# Patient Record
Sex: Female | Born: 1960 | Race: White | Hispanic: No | Marital: Single | Smoking: Never smoker
Health system: Southern US, Community
[De-identification: ages and names within clinical notes are randomized; demographics above are authoritative.]

## PROBLEM LIST (undated history)

## (undated) DIAGNOSIS — E7800 Pure hypercholesterolemia, unspecified: Secondary | ICD-10-CM

---

## 1997-09-06 ENCOUNTER — Inpatient Hospital Stay (HOSPITAL_COMMUNITY): Admission: AD | Admit: 1997-09-06 | Discharge: 1997-09-10 | Payer: Self-pay | Admitting: *Deleted

## 1997-09-12 ENCOUNTER — Encounter: Admission: RE | Admit: 1997-09-12 | Discharge: 1997-12-11 | Payer: Self-pay | Admitting: Obstetrics and Gynecology

## 2001-08-14 ENCOUNTER — Other Ambulatory Visit: Admission: RE | Admit: 2001-08-14 | Discharge: 2001-08-14 | Payer: Self-pay | Admitting: Obstetrics and Gynecology

## 2003-06-25 ENCOUNTER — Other Ambulatory Visit: Admission: RE | Admit: 2003-06-25 | Discharge: 2003-06-25 | Payer: Self-pay | Admitting: Obstetrics and Gynecology

## 2004-04-21 ENCOUNTER — Other Ambulatory Visit: Admission: RE | Admit: 2004-04-21 | Discharge: 2004-04-21 | Payer: Self-pay | Admitting: Obstetrics and Gynecology

## 2004-12-22 ENCOUNTER — Other Ambulatory Visit: Admission: RE | Admit: 2004-12-22 | Discharge: 2004-12-22 | Payer: Self-pay | Admitting: Obstetrics and Gynecology

## 2013-10-26 ENCOUNTER — Emergency Department (HOSPITAL_COMMUNITY): Payer: BC Managed Care – PPO

## 2013-10-26 ENCOUNTER — Encounter (HOSPITAL_COMMUNITY): Payer: Self-pay | Admitting: Emergency Medicine

## 2013-10-26 ENCOUNTER — Emergency Department (HOSPITAL_COMMUNITY)
Admission: EM | Admit: 2013-10-26 | Discharge: 2013-10-26 | Disposition: A | Discharge status: Home / Self Care | Payer: BC Managed Care – PPO | Attending: Emergency Medicine | Admitting: Emergency Medicine

## 2013-10-26 DIAGNOSIS — S8012XA Contusion of left lower leg, initial encounter: Secondary | ICD-10-CM

## 2013-10-26 DIAGNOSIS — Y9389 Activity, other specified: Secondary | ICD-10-CM | POA: Insufficient documentation

## 2013-10-26 DIAGNOSIS — S5002XA Contusion of left elbow, initial encounter: Secondary | ICD-10-CM

## 2013-10-26 DIAGNOSIS — Y92410 Unspecified street and highway as the place of occurrence of the external cause: Secondary | ICD-10-CM | POA: Insufficient documentation

## 2013-10-26 DIAGNOSIS — S5000XA Contusion of unspecified elbow, initial encounter: Secondary | ICD-10-CM | POA: Insufficient documentation

## 2013-10-26 DIAGNOSIS — E7800 Pure hypercholesterolemia, unspecified: Secondary | ICD-10-CM | POA: Insufficient documentation

## 2013-10-26 DIAGNOSIS — S8010XA Contusion of unspecified lower leg, initial encounter: Secondary | ICD-10-CM | POA: Insufficient documentation

## 2013-10-26 DIAGNOSIS — Z79899 Other long term (current) drug therapy: Secondary | ICD-10-CM | POA: Insufficient documentation

## 2013-10-26 HISTORY — DX: Pure hypercholesterolemia, unspecified: E78.00

## 2013-10-26 MED ORDER — IBUPROFEN 200 MG PO TABS
600.0000 mg | ORAL_TABLET | Freq: Once | ORAL | Status: AC
  Administered 2013-10-26: 600 mg via ORAL
  Filled 2013-10-26 (×2): qty 1

## 2013-10-26 MED ORDER — OXYCODONE-ACETAMINOPHEN 5-325 MG PO TABS: 1.0000 | ORAL_TABLET | Freq: Four times a day (QID) | ORAL | Status: DC | PRN
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
## 2014-09-29 ENCOUNTER — Other Ambulatory Visit: Payer: Self-pay | Admitting: Obstetrics and Gynecology

## 2014-09-29 DIAGNOSIS — R928 Other abnormal and inconclusive findings on diagnostic imaging of breast: Secondary | ICD-10-CM

## 2014-10-09 ENCOUNTER — Other Ambulatory Visit: Payer: Self-pay | Admitting: Obstetrics and Gynecology

## 2014-10-09 ENCOUNTER — Ambulatory Visit
Admission: RE | Admit: 2014-10-09 | Discharge: 2014-10-09 | Disposition: A | Discharge status: Home / Self Care | Payer: BC Managed Care – PPO | Source: Ambulatory Visit | Attending: Obstetrics and Gynecology | Admitting: Obstetrics and Gynecology

## 2014-10-09 DIAGNOSIS — R928 Other abnormal and inconclusive findings on diagnostic imaging of breast: Secondary | ICD-10-CM

## 2016-05-19 ENCOUNTER — Ambulatory Visit (INDEPENDENT_AMBULATORY_CARE_PROVIDER_SITE_OTHER): Payer: Worker's Compensation | Admitting: Sports Medicine

## 2016-05-19 ENCOUNTER — Encounter (INDEPENDENT_AMBULATORY_CARE_PROVIDER_SITE_OTHER): Payer: Self-pay | Admitting: Sports Medicine

## 2016-05-19 VITALS — BP 130/81 | HR 90 | Ht 62.0 in | Wt 185.0 lb

## 2016-05-19 DIAGNOSIS — S42291A Other displaced fracture of upper end of right humerus, initial encounter for closed fracture: Secondary | ICD-10-CM

## 2016-05-19 MED ORDER — OXYCODONE-ACETAMINOPHEN 5-325 MG PO TABS: 1.0000 | ORAL_TABLET | ORAL | 0 refills | Status: DC | PRN

## 2016-05-19 NOTE — Progress Notes (Signed)
   Catherine PulseDianne M Caterino - 55 y.o. female MRN [id number]  Date of birth: 05-08-1961  Office Visit Note: Visit Date: 05/19/2016 PCP: Johny BlamerHARRIS, WILLIAM, MD Referred by: Johny BlamerHarris, William, MD  Subjective: Chief Complaint  Patient presents with  . Right Shoulder - Pain  . Neck - Pain  . Fracture   HPI: Patient fell yesterday landing on her right shoulder. Seen in urgent care & x-rays obtained as below. She's been in a shoulder immobilizer. Taking ibuprofen pain medications & minimizing use of the upper extremity has been only mildly helpful. No prior issues with the shoulder. No significant pain radiating down into the hand. She did have difficult time sleeping last night.     ROS Otherwise per HPI.  Assessment & Plan: Visit Diagnoses:  1. Other closed displaced fracture of proximal end of right humerus, initial encounter     Plan: Findings:  Acute fracture of the proximal right humeral head & neck with nondisplaced fracture line through the surgical neck. Overall anatomic alignment. Will anticipate nonsurgical management reevaluate in one week with repeat 2V x-ray of the right shoulder. Out of work until reevaluation. Pain medication provided today.    Meds & Orders:  Meds ordered this encounter  Medications  . oxyCODONE-acetaminophen (PERCOCET/ROXICET) 5-325 MG tablet    Sig: Take 1 tablet by mouth every 4 (four) hours as needed for severe pain.    Dispense:  30 tablet    Refill:  0   No orders of the defined types were placed in this encounter.   Follow-up: Return in about 1 week (around 05/26/2016) for repeat X-rays.   Procedures: No procedures performed  No notes on file   Clinical History: No specialty comments available.  She reports that she has never smoked. She has never used smokeless tobacco. No results for input(s): HGBA1C, LABURIC in the last 8760 hours.  Objective:  VS:  HT:5\' 2"  (157.5 cm)   WT:185 lb (83.9 kg)  BMI:33.9    BP:130/81  HR:90bpm  TEMP: ( )  RESP:    Physical Exam  Constitutional:  Adult female. No acute distress. Alert & appropriate. Right upper extremity is in an arm sling. She has generalized swelling in the proximal arm but hand & elbow are normal appearing. Strength is intact. Radial nerve is functioning. She has pain with any type of rotation of the humerus but the humerus doesn't move as a unit. Sensation is intact to light touch. She has moderate tenderness over the clavicle & ac joint but this is mild compared to palpation of the proximal humerus which is severe.    Ortho Exam Imaging: Outside x-rays reviewed as above   Past Medical/Family/Surgical/Social History: Medications & Allergies reviewed per EMR There are no active problems to display for this patient.  Past Medical History:  Diagnosis Date  . High cholesterol    No family history on file. Past Surgical History:  Procedure Laterality Date  . CESAREAN SECTION     Social History   Occupational History  . Not on file.   Social History Main Topics  . Smoking status: Never Smoker  . Smokeless tobacco: Never Used  . Alcohol use No  . Drug use: No  . Sexual activity: Not on file

## 2016-05-19 NOTE — Patient Instructions (Signed)
Continue taking ibuprofen on a regular basis Pain medication only as needed. Continue using the shoulder immobilizer provided.

## 2016-05-26 ENCOUNTER — Ambulatory Visit (INDEPENDENT_AMBULATORY_CARE_PROVIDER_SITE_OTHER): Payer: Worker's Compensation

## 2016-05-26 ENCOUNTER — Encounter (INDEPENDENT_AMBULATORY_CARE_PROVIDER_SITE_OTHER): Payer: Self-pay | Admitting: Sports Medicine

## 2016-05-26 ENCOUNTER — Ambulatory Visit (INDEPENDENT_AMBULATORY_CARE_PROVIDER_SITE_OTHER): Payer: Worker's Compensation | Admitting: Sports Medicine

## 2016-05-26 VITALS — BP 124/74 | HR 67 | Ht 62.0 in | Wt 185.0 lb

## 2016-05-26 DIAGNOSIS — M25511 Pain in right shoulder: Secondary | ICD-10-CM

## 2016-05-26 DIAGNOSIS — S42291A Other displaced fracture of upper end of right humerus, initial encounter for closed fracture: Secondary | ICD-10-CM

## 2016-05-26 MED ORDER — NAPROXEN 500 MG PO TABS: 500.0000 mg | ORAL_TABLET | Freq: Two times a day (BID) | ORAL | 1 refills | Status: DC

## 2016-05-26 NOTE — Progress Notes (Signed)
   Catherine Bean - 55 y.o. female MRN [id number]  Date of birth: 10-03-1960  Office Visit Note: Visit Date: 05/26/2016 PCP: Johny BlamerHARRIS, WILLIAM, MD Referred by: Johny BlamerHarris, William, MD  Subjective: Chief Complaint  Patient presents with  . Right Shoulder - Follow-up  . Follow-up    Patient states soreness this morning.  Patient is wearing a sling.   HPI: She continues having pain on a daily basis. Pain is severe with any type shoulder motion. Using arm immobilizer. Taking iuprofen with only minimal improvement.     ROS Otherwise per HPI.  Assessment & Plan: Visit Diagnoses:  1. Other closed displaced fracture of proximal end of right humerus, initial encounter   2. Acute pain of right shoulder     Plan: Findings:  Fracture stable. Continue with arm immobilizer. Will transition to naproxen instead of ibuprofen. I continue as needed. 2 week follow-up for repeat 2V x-ray. Out of work 2 weeks   Meds & Orders:  Meds ordered this encounter  Medications  . naproxen (NAPROSYN) 500 MG tablet    Sig: Take 1 tablet (500 mg total) by mouth 2 (two) times daily with a meal.    Dispense:  60 tablet    Refill:  1    Orders Placed This Encounter  Procedures  . XR Shoulder Right    Follow-up: Return in about 2 weeks (around 06/09/2016) for repeat X-rays.   Procedures: No procedures performed  No notes on file   Clinical History: No specialty comments available.  She reports that she has never smoked. She has never used smokeless tobacco. No results for input(s): HGBA1C, LABURIC in the last 8760 hours.  Objective:  VS:  HT:5\' 2"  (157.5 cm)   WT:185 lb (83.9 kg)  BMI:33.9    BP:124/74  HR:67bpm  TEMP: ( )  RESP:  Physical Exam  Ortho Exam  Adult female. No acute distress. Wearing an arm immobilizer. Right arm has bruising along the medial aspect of the upper arm. Pain with any type of range of motion with shoulder does move his the unit between 20 & 30 of internal & external  rotation. Abduction & forward flexion limited to 10:15 degrees by pain. Radial pulses 2+/4. Grip strength intact. Imaging: Xr Shoulder Right  Result Date: 05/26/2016 Findings: Proximal humerus fracture is overall well aligned with significant shift). Slightly comminuted. Impression: Slightly comminuted stable proximal humerus fracture.   Past Medical/Family/Surgical/Social History: Medications & Allergies reviewed per EMR There are no active problems to display for this patient.  Past Medical History:  Diagnosis Date  . High cholesterol    No family history on file. Past Surgical History:  Procedure Laterality Date  . CESAREAN SECTION     Social History   Occupational History  . Not on file.   Social History Main Topics  . Smoking status: Never Smoker  . Smokeless tobacco: Never Used  . Alcohol use No  . Drug use: No  . Sexual activity: Not on file

## 2016-06-09 ENCOUNTER — Ambulatory Visit (INDEPENDENT_AMBULATORY_CARE_PROVIDER_SITE_OTHER): Payer: Worker's Compensation

## 2016-06-09 ENCOUNTER — Ambulatory Visit (INDEPENDENT_AMBULATORY_CARE_PROVIDER_SITE_OTHER): Payer: Worker's Compensation | Admitting: Sports Medicine

## 2016-06-09 VITALS — BP 126/75 | HR 90

## 2016-06-09 DIAGNOSIS — S42291A Other displaced fracture of upper end of right humerus, initial encounter for closed fracture: Secondary | ICD-10-CM

## 2016-06-09 DIAGNOSIS — M25511 Pain in right shoulder: Secondary | ICD-10-CM

## 2016-06-09 NOTE — Progress Notes (Signed)
   Catherine Bean - 55 y.o. female MRN [id number]  Date of birth: [birth year]  Office Visit Note: Visit Date: 06/09/2016 PCP: Johny BlamerHARRIS, WILLIAM, MD Referred by: Johny BlamerHarris, William, MD  Subjective: Chief Complaint  Patient presents with  . Right Shoulder - Follow-up    Patient states she does see some improvement. Still "achy". Wearing sling. Shoulder still waking her at night. Wearing a bra is painful. She is getting some ROM back in are without as much pain. Wants to discuss going back to work.   HPI: Status post fall on 05/18/2016. She reports continuing to work on range of motion of the shoulder & she is able to abduction & internal & external rotate significantly better. She is taking Aleve only. Continues to be out of work. She would like to return. No significant dysthesia. ROS: Otherwise per HPI.   Clinical History: No specialty comments available.  She reports that she has never smoked. She has never used smokeless tobacco.  No results for input(s): HGBA1C, LABURIC in the last 8760 hours.  Assessment & Plan: Visit Diagnoses:    ICD-9-CM ICD-10-CM   1. Other closed displaced fracture of proximal end of right humerus, initial encounter 812.09 S42.291A XR Shoulder Right  2. Acute pain of right shoulder 719.41 M25.511 XR Shoulder Right    Plan: X-rays are stable. We will allow her to return to work starting next week at the four-week mark & have restrictions as outlined. I would like for her to continue working on Codman exercises as well as gentle range of motion but nothing ever had. Continue working on elbow flexion & extension as well as coming out of the sling on occasion. Okay to continue until I see her next time will need to discontinue at that time. She is not requiring pain medicines at this time other than Aleve. Follow-up: Return in about 2 weeks (around 06/23/2016) for repeat X-rays.  Meds: No orders of the defined types were placed in this encounter.  Procedures: No notes on  file   Objective:  VS:  HT:    WT:   BMI:     BP:126/75  HR:90bpm  TEMP: ( )  RESP:  Physical Exam: Adult female. No acute distress. Wearing an arm immobilizer. Her right arm is interested shoulder immobilizer but she is able to abduction actively to 30 & internally/externally rotate approximately 60.  Grip strength is intact. Radial pulse 2+/4. Decreased swelling & decreased pain with palpation. Imaging: Xr Shoulder Right  Result Date: 06/09/2016 Findings: Proximal humerus fracture is overall well aligned without significant shift. Slightly comminuted. Impression: Slightly comminuted stable proximal humerus fracture.  Xr Shoulder Right  Result Date: 05/26/2016 Findings: Proximal humerus fracture is overall well aligned with significant shift). Slightly comminuted. Impression: Slightly comminuted stable proximal humerus fracture.   Past Medical/Family/Surgical/Social History: Medications & Allergies reviewed per EMR There are no active problems to display for this patient.  Past Medical History:  Diagnosis Date  . High cholesterol    No family history on file. Past Surgical History:  Procedure Laterality Date  . CESAREAN SECTION     Social History   Occupational History  . Not on file.   Social History Main Topics  . Smoking status: Never Smoker  . Smokeless tobacco: Never Used  . Alcohol use No  . Drug use: No  . Sexual activity: Not on file

## 2016-06-23 ENCOUNTER — Encounter (INDEPENDENT_AMBULATORY_CARE_PROVIDER_SITE_OTHER): Payer: Self-pay | Admitting: Sports Medicine

## 2016-06-23 ENCOUNTER — Ambulatory Visit (INDEPENDENT_AMBULATORY_CARE_PROVIDER_SITE_OTHER): Payer: Worker's Compensation | Admitting: Sports Medicine

## 2016-06-23 ENCOUNTER — Ambulatory Visit (INDEPENDENT_AMBULATORY_CARE_PROVIDER_SITE_OTHER): Payer: Worker's Compensation

## 2016-06-23 VITALS — BP 130/74 | HR 71 | Ht 62.0 in | Wt 185.0 lb

## 2016-06-23 DIAGNOSIS — M25511 Pain in right shoulder: Secondary | ICD-10-CM

## 2016-06-23 DIAGNOSIS — S42291D Other displaced fracture of upper end of right humerus, subsequent encounter for fracture with routine healing: Secondary | ICD-10-CM

## 2016-06-23 NOTE — Progress Notes (Signed)
   Catherine PulseDianne M Bean - 55 y.o. female MRN [id number]  Date of birth: Sep 21, 1960  Office Visit Note: Visit Date: 06/23/2016 PCP: Johny BlamerHARRIS, WILLIAM, MD Referred by: Johny BlamerHarris, William, MD  Subjective: Chief Complaint  Patient presents with  . Right Shoulder - Follow-up  . Follow-up    Patient states that she thinks that shoulder is doing good.  Wearing sling, has more mobility.  Doing 4 hours at work.  Would like to know how much longer in wearing the sling?   HPI:  S/p fall at work on 05/18/2016. No complaints. Agree with history as above. No significant numbness or tingling. Pain is significantly improved. No longer having significant discomfort at night. Not taking any medications. ROS: Otherwise per HPI.   Clinical History: No specialty comments available.  She reports that she has never smoked. She has never used smokeless tobacco.  No results for input(s): HGBA1C, LABURIC in the last 8760 hours.  Assessment & Plan: Visit Diagnoses:    ICD-9-CM ICD-10-CM   1. Acute pain of right shoulder 719.41 M25.511 XR Shoulder Right    Plan: Fracture is stable & healing well. We will have her plan to increase her daily workload to 8 hours after the new year. Follow-up in 4 weeks for repeat x-rays with Dr. Lajoyce Cornersuda as I am changing practices. I'm happy to see her in my new location however she would prefer to stay at this location. Increasing range of motion & physical activity. If any lack of improvement follow-up with did discuss possible referral to physical therapy practice patient will do well without this. Follow-up: Return in about 3 weeks (around 07/14/2016) for with Dr. Lajoyce Cornersuda for routine post fracture care and hopeful release to full duty.  Meds: No orders of the defined types were placed in this encounter.  Procedures: No notes on file   Objective:  VS:  HT:5\' 2"  (157.5 cm)   WT:185 lb (83.9 kg)  BMI:33.9    BP:130/74  HR:71bpm  TEMP: ( )  RESP:  Physical Exam:  Shoulder is overall well line.  She has improved range of motion with 40 of abduction & in [facility] flexion. Grip strength is intact. Internal rotation & external rotation strength is intact although mildly painful to resistance. Sensation intact to light touch. Imaging: Xr Shoulder Right  Result Date: 06/23/2016 Findings: Proximal humerus fracture is overall well aligned without significant shift. Slightly comminuted.  No interval change. Evidence of early sclerosis. Impression: Slightly comminuted stable proximal humerus fracture.  Xr Shoulder Right  Result Date: 06/09/2016 Findings: Proximal humerus fracture is overall well aligned without significant shift. Slightly comminuted. Impression: Slightly comminuted stable proximal humerus fracture.  Xr Shoulder Right  Result Date: 05/26/2016 Findings: Proximal humerus fracture is overall well aligned with significant shift). Slightly comminuted. Impression: Slightly comminuted stable proximal humerus fracture.   Past Medical/Family/Surgical/Social History: Medications & Allergies reviewed per EMR There are no active problems to display for this patient.  Past Medical History:  Diagnosis Date  . High cholesterol    No family history on file. Past Surgical History:  Procedure Laterality Date  . CESAREAN SECTION     Social History   Occupational History  . Not on file.   Social History Main Topics  . Smoking status: Never Smoker  . Smokeless tobacco: Never Used  . Alcohol use No  . Drug use: No  . Sexual activity: Not on file

## 2016-07-15 ENCOUNTER — Ambulatory Visit (INDEPENDENT_AMBULATORY_CARE_PROVIDER_SITE_OTHER): Payer: Worker's Compensation | Admitting: Orthopedic Surgery

## 2016-07-15 VITALS — Ht 62.0 in | Wt 185.0 lb

## 2016-07-15 DIAGNOSIS — S42291D Other displaced fracture of upper end of right humerus, subsequent encounter for fracture with routine healing: Secondary | ICD-10-CM

## 2016-07-15 DIAGNOSIS — S42201D Unspecified fracture of upper end of right humerus, subsequent encounter for fracture with routine healing: Secondary | ICD-10-CM | POA: Insufficient documentation

## 2016-07-15 NOTE — Progress Notes (Signed)
Office Visit Note   Patient: Catherine Bean           Date of Birth: [birth year]           MRN: [id number] Visit Date: 07/15/2016              Requested by: Johny Blamer, MD [phone] [address] [place], [place] [address] PCP: Johny Blamer, MD  Chief Complaint  Patient presents with  . Right Shoulder - Follow-up    Work comp injury s/p fall at work 05/18/16     HPI: Patient has returned to work for 8 hours day. She states that she is not sleeping well. "everytime I turn I feel it" she does not think that she is able to do any more than 8 hours a day. States that she has a draft at the window of her office and the cold makes her shoulder achy. Questions when she will get her ROM back. She is able to do more of her ADLs she is doing pendulum exercise and wall walking and taking naproxen prn pain. And states that this works well for her pain. Autumn L Forrest, RMA  Him the patient is a 56 year old woman who is seen today in follow-up for a right proximal humerus fracture. Is well known to Dr. Berline Chough. This is a workers comp case.  Continues to have aching pain which is worse at the end of the day. States occasionally wakes from sleep due to increased pain. Also complains that the cold weather has really setback her healing. Wonders when she will have full range of motion. However states she is able to do all of her ADLs as well as her required duties at work. feels she is unable to work any longer than 8 hours a day at this time.    Assessment & Plan: Visit Diagnoses:  1. Other closed displaced fracture of proximal end of right humerus with routine healing, subsequent encounter     Plan: Continue aggressive work on home exercise program. Him recommended she continue taking Aleve him as needed for pain. Heat before exercise program may be helpful. Have provided another note for work.  Follow-Up Instructions: Return in about 2 months (around 09/12/2016).    Exam: Patient is alert  and oriented. No adenopathy. Well-dressed. Normal affect. Respirations easy.  Right Ankle Exam  Other  Sensation: normal   Comments:  Grip strength is intact. Internal rotation & external rotation strength is intact although mildly painful to resistance.    Right Shoulder Exam   Tenderness  The patient is experiencing no tenderness.    Range of Motion  Active Abduction: 90   Muscle Strength  The patient has normal right shoulder strength.  Other  Pulse: present       Imaging: No results found.  Orders:  No orders of the defined types were placed in this encounter.  No orders of the defined types were placed in this encounter.    Procedures: No procedures performed  Clinical Data: No additional findings.  Subjective: Review of Systems  Constitutional: Negative for chills and fever.  Musculoskeletal: Positive for arthralgias.    Objective: Vital Signs: Ht 5\' 2"  (1.575 m)   Wt 185 lb (83.9 kg)   BMI 33.84 kg/m   Specialty Comments:  No specialty comments available.  PMFS History: Patient Active Problem List   Diagnosis Date Noted  . Closed fracture of proximal end of right humerus with routine healing 07/15/2016   Past  Medical History:  Diagnosis Date  . High cholesterol     No family history on file.  Past Surgical History:  Procedure Laterality Date  . CESAREAN SECTION     Social History   Occupational History  . Not on file.   Social History Main Topics  . Smoking status: Never Smoker  . Smokeless tobacco: Never Used  . Alcohol use No  . Drug use: No  . Sexual activity: Not on file

## 2016-10-05 ENCOUNTER — Telehealth (INDEPENDENT_AMBULATORY_CARE_PROVIDER_SITE_OTHER): Payer: Self-pay

## 2016-10-05 NOTE — Telephone Encounter (Signed)
Fax tomorrow office note to [employer]Corrine Phillips after pt is seen per her request

## 2016-10-06 ENCOUNTER — Ambulatory Visit (INDEPENDENT_AMBULATORY_CARE_PROVIDER_SITE_OTHER): Payer: Worker's Compensation | Admitting: Family

## 2016-10-06 ENCOUNTER — Encounter (INDEPENDENT_AMBULATORY_CARE_PROVIDER_SITE_OTHER): Payer: Self-pay | Admitting: Orthopedic Surgery

## 2016-10-06 ENCOUNTER — Ambulatory Visit (INDEPENDENT_AMBULATORY_CARE_PROVIDER_SITE_OTHER): Payer: Worker's Compensation

## 2016-10-06 DIAGNOSIS — M25511 Pain in right shoulder: Secondary | ICD-10-CM

## 2016-10-06 DIAGNOSIS — S42291D Other displaced fracture of upper end of right humerus, subsequent encounter for fracture with routine healing: Secondary | ICD-10-CM | POA: Diagnosis not present

## 2016-10-10 NOTE — Progress Notes (Signed)
Office Visit Note   Patient: Catherine Bean           Date of Birth: 03/07/1961           MRN: [id number] Visit Date: 10/06/2016              Requested by: Johny Blamer, MD [phone] [address] [place], [place] [address] PCP: Johny Blamer, MD  Chief Complaint  Patient presents with  . Right Shoulder - Follow-up      HPI: The patient is a 56 year old woman who presents today for evaluation of right shoulder pain. She is status post a proximal humerus fracture in November of last year. This is a workman's comp case. She has been having good success with home exercises. However she states she is still lacks some range of motion. Is having difficulty with extension and abduction. Does state she is able to reach overhead and do her hair and do most of the things she likes to do however feels this little bit more range of motion to be regained. Has been using ibuprofen as needed for pain and feels this is adequate.  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  Assessment & Plan: Visit Diagnoses:  1. Acute pain of right shoulder   2. Other closed displaced fracture of proximal end of right humerus with routine healing, subsequent encounter     Plan: Given instructions as well as a handout for further sole shoulder exercises and stretches. She will continue with her home exercise program may continue using ice as needed. We'll follow-up in office as needed in about 6 weeks. May consider physical therapy at that time.  Follow-Up Instructions: Return in about 4 weeks (around 11/03/2016).   Right Shoulder Exam   Tenderness  The patient is experiencing no tenderness.    Range of Motion  Active Abduction: normal  Forward Flexion: 120   Muscle Strength  The patient has normal right shoulder strength.  Tests  Impingement: negative      Patient is alert,  oriented, no adenopathy, well-dressed, normal affect, normal respiratory effort.   Imaging: No results found.  Labs: No results found for: HGBA1C, ESRSEDRATE, CRP, LABURIC, REPTSTATUS, GRAMSTAIN, CULT, LABORGA  Orders:  Orders Placed This Encounter  Procedures  . XR Shoulder Right   No orders of the defined types were placed in this encounter.    Procedures: No procedures performed  Clinical Data: No additional findings.  ROS:  All other systems negative, except as noted in the HPI. Review of Systems  Constitutional: Negative for chills and fever.  Musculoskeletal: Positive for arthralgias. Negative for joint swelling.    Objective: Vital Signs: There were no vitals taken for this visit.  Specialty Comments:  No specialty comments available.  PMFS History: Patient Active Problem List   Diagnosis Date Noted  . Closed fracture of proximal end of right humerus with routine healing 07/15/2016   Past Medical History:  Diagnosis Date  . High cholesterol     No family history on file.  Past Surgical History:  Procedure Laterality Date  . CESAREAN SECTION     Social History   Occupational History  . Not on file.   Social History Main Topics  . Smoking status: Never Smoker  . Smokeless tobacco: Never Used  . Alcohol use No  . Drug use: No  . Sexual  activity: Not on file

## 2016-10-11 NOTE — Telephone Encounter (Signed)
faxed

## 2016-11-18 ENCOUNTER — Ambulatory Visit (INDEPENDENT_AMBULATORY_CARE_PROVIDER_SITE_OTHER): Payer: Self-pay | Admitting: Orthopedic Surgery

## 2016-11-18 ENCOUNTER — Encounter (INDEPENDENT_AMBULATORY_CARE_PROVIDER_SITE_OTHER): Payer: Self-pay | Admitting: Orthopedic Surgery

## 2016-11-18 ENCOUNTER — Ambulatory Visit (INDEPENDENT_AMBULATORY_CARE_PROVIDER_SITE_OTHER): Payer: Worker's Compensation | Admitting: Family

## 2016-11-18 VITALS — Ht 62.0 in | Wt 185.0 lb

## 2016-11-18 DIAGNOSIS — S42291D Other displaced fracture of upper end of right humerus, subsequent encounter for fracture with routine healing: Secondary | ICD-10-CM | POA: Diagnosis not present

## 2016-11-18 NOTE — Progress Notes (Signed)
   Office Visit Note   Patient: Catherine PulseDianne M Donado           Date of Birth: [birth year]           MRN: [id number] Visit Date: 11/18/2016              Requested by: Johny BlamerHarris, William, MD [phone] [address] [place], [place] [id number] PCP: Johny BlamerHarris, William, MD  Chief Complaint  Patient presents with  . Right Shoulder - Follow-up      HPI: The patient is a 56 year old woman who presents today for evaluation of right shoulder pain. She is status post a proximal humerus fracture in November of last year. This is a workman's comp case. She feels she has reached a wall with her with home exercises. Has purchased a pulley system for her door frame and uses this for ROM exercises.   States she is still lacks some range of motion. Is having difficulty with extension and abduction. Does state she is able to reach overhead and do her hair and do most of the things she likes to do however feels she still has a bit more range of motion to be regained as compared to her left upper extremity.   No difficulty with pain.    Assessment & Plan: Visit Diagnoses:  1. Other closed displaced fracture of proximal end of right humerus with routine healing, subsequent encounter     Plan: will set her up for PT. Will get this approved with her WC prior. Follow up in office as needed.   Follow-Up Instructions: Return if symptoms worsen or fail to improve.   Right Shoulder Exam   Tenderness  The patient is experiencing no tenderness.    Muscle Strength  The patient has normal right shoulder strength.  Tests  Impingement: negative  Comments:  Lacks 15 degrees of full flexion and 10 degrees of abduction      Patient is alert, oriented, no adenopathy, well-dressed, normal affect, normal respiratory effort.   Imaging: No results found.  Labs: No results found for: HGBA1C, ESRSEDRATE, CRP, LABURIC, REPTSTATUS, GRAMSTAIN, CULT, LABORGA  Orders:  No orders of the defined types were placed in  this encounter.  No orders of the defined types were placed in this encounter.    Procedures: No procedures performed  Clinical Data: No additional findings.  ROS:  All other systems negative, except as noted in the HPI. Review of Systems  Constitutional: Negative for chills and fever.  Musculoskeletal: Positive for arthralgias. Negative for joint swelling.    Objective: Vital Signs: Ht 5\' 2"  (1.575 m)   Wt 185 lb (83.9 kg)   BMI 33.84 kg/m   Specialty Comments:  No specialty comments available.  PMFS History: Patient Active Problem List   Diagnosis Date Noted  . Closed fracture of proximal end of right humerus with routine healing 07/15/2016   Past Medical History:  Diagnosis Date  . High cholesterol     History reviewed. No pertinent family history.  Past Surgical History:  Procedure Laterality Date  . CESAREAN SECTION     Social History   Occupational History  . Not on file.   Social History Main Topics  . Smoking status: Never Smoker  . Smokeless tobacco: Never Used  . Alcohol use No  . Drug use: No  . Sexual activity: Not on file

## 2016-12-01 ENCOUNTER — Telehealth: Payer: Self-pay | Admitting: Physical Therapy

## 2016-12-01 NOTE — Telephone Encounter (Signed)
11/21/16, pt states is WC related, have not received WC info from patient. Called 12/01/16, no answer

## 2017-01-10 ENCOUNTER — Other Ambulatory Visit: Payer: Self-pay | Admitting: Obstetrics and Gynecology

## 2017-01-10 DIAGNOSIS — R928 Other abnormal and inconclusive findings on diagnostic imaging of breast: Secondary | ICD-10-CM

## 2017-01-16 ENCOUNTER — Ambulatory Visit
Admission: RE | Admit: 2017-01-16 | Discharge: 2017-01-16 | Disposition: A | Discharge status: Home / Self Care | Payer: BC Managed Care – PPO | Source: Ambulatory Visit | Attending: Obstetrics and Gynecology | Admitting: Obstetrics and Gynecology

## 2017-01-16 DIAGNOSIS — R928 Other abnormal and inconclusive findings on diagnostic imaging of breast: Secondary | ICD-10-CM

## 2019-01-17 ENCOUNTER — Ambulatory Visit: Payer: BC Managed Care – PPO | Admitting: Registered"

## 2021-01-06 ENCOUNTER — Other Ambulatory Visit: Payer: Self-pay | Admitting: Family Medicine

## 2021-01-06 ENCOUNTER — Ambulatory Visit
Admission: RE | Admit: 2021-01-06 | Discharge: 2021-01-06 | Disposition: A | Discharge status: Home / Self Care | Payer: BC Managed Care – PPO | Source: Ambulatory Visit | Attending: Family Medicine | Admitting: Family Medicine

## 2021-01-06 ENCOUNTER — Other Ambulatory Visit: Payer: Self-pay

## 2021-01-06 DIAGNOSIS — M79644 Pain in right finger(s): Secondary | ICD-10-CM

## 2021-10-12 ENCOUNTER — Ambulatory Visit
Admission: RE | Admit: 2021-10-12 | Discharge: 2021-10-12 | Disposition: A | Discharge status: Home / Self Care | Payer: BC Managed Care – PPO | Source: Ambulatory Visit | Attending: Family Medicine | Admitting: Family Medicine

## 2021-10-12 ENCOUNTER — Other Ambulatory Visit: Payer: Self-pay | Admitting: Family Medicine

## 2021-10-12 DIAGNOSIS — M545 Low back pain, unspecified: Secondary | ICD-10-CM

## 2022-12-24 IMAGING — CR DG LUMBAR SPINE 2-3V
3 series · 3 of 3 positions shown · non-contrast
Comparison: None.

CLINICAL DATA: Lumbar pain, tenderness at L1-L2.

EXAM:
LUMBAR SPINE - 2-3 VIEW

[w lumbar spine ap]
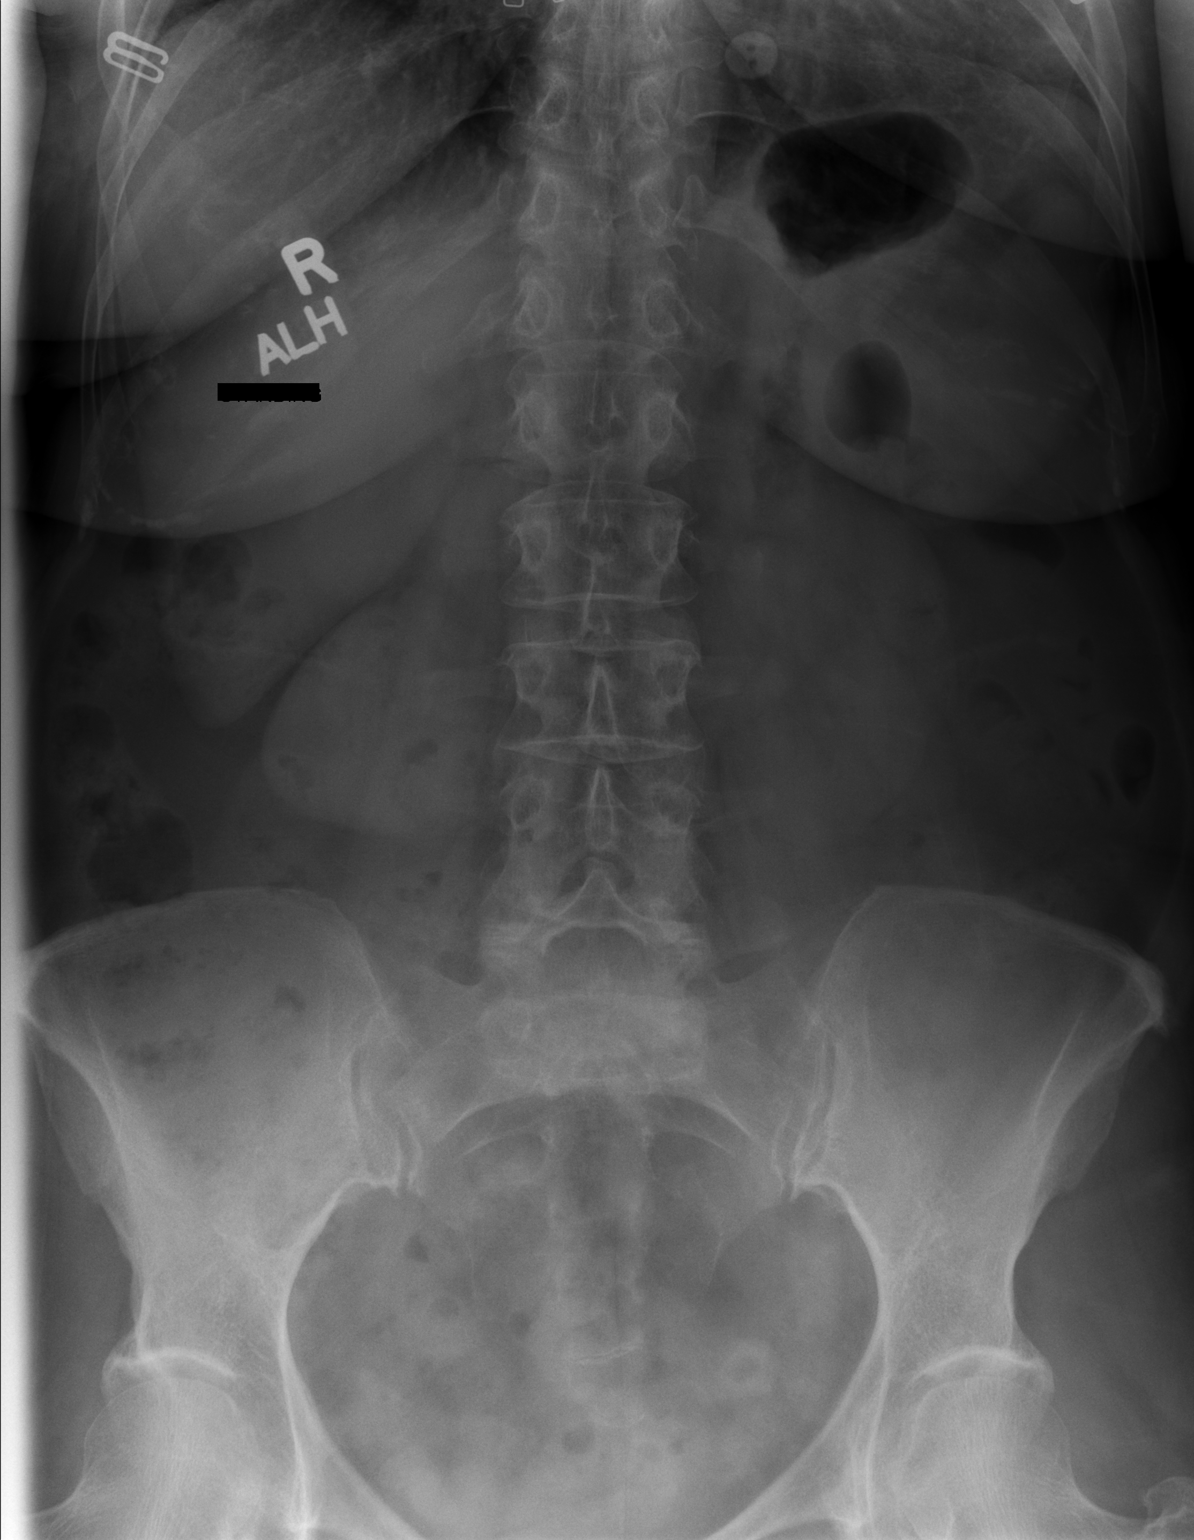

[w lumbar spine lat]
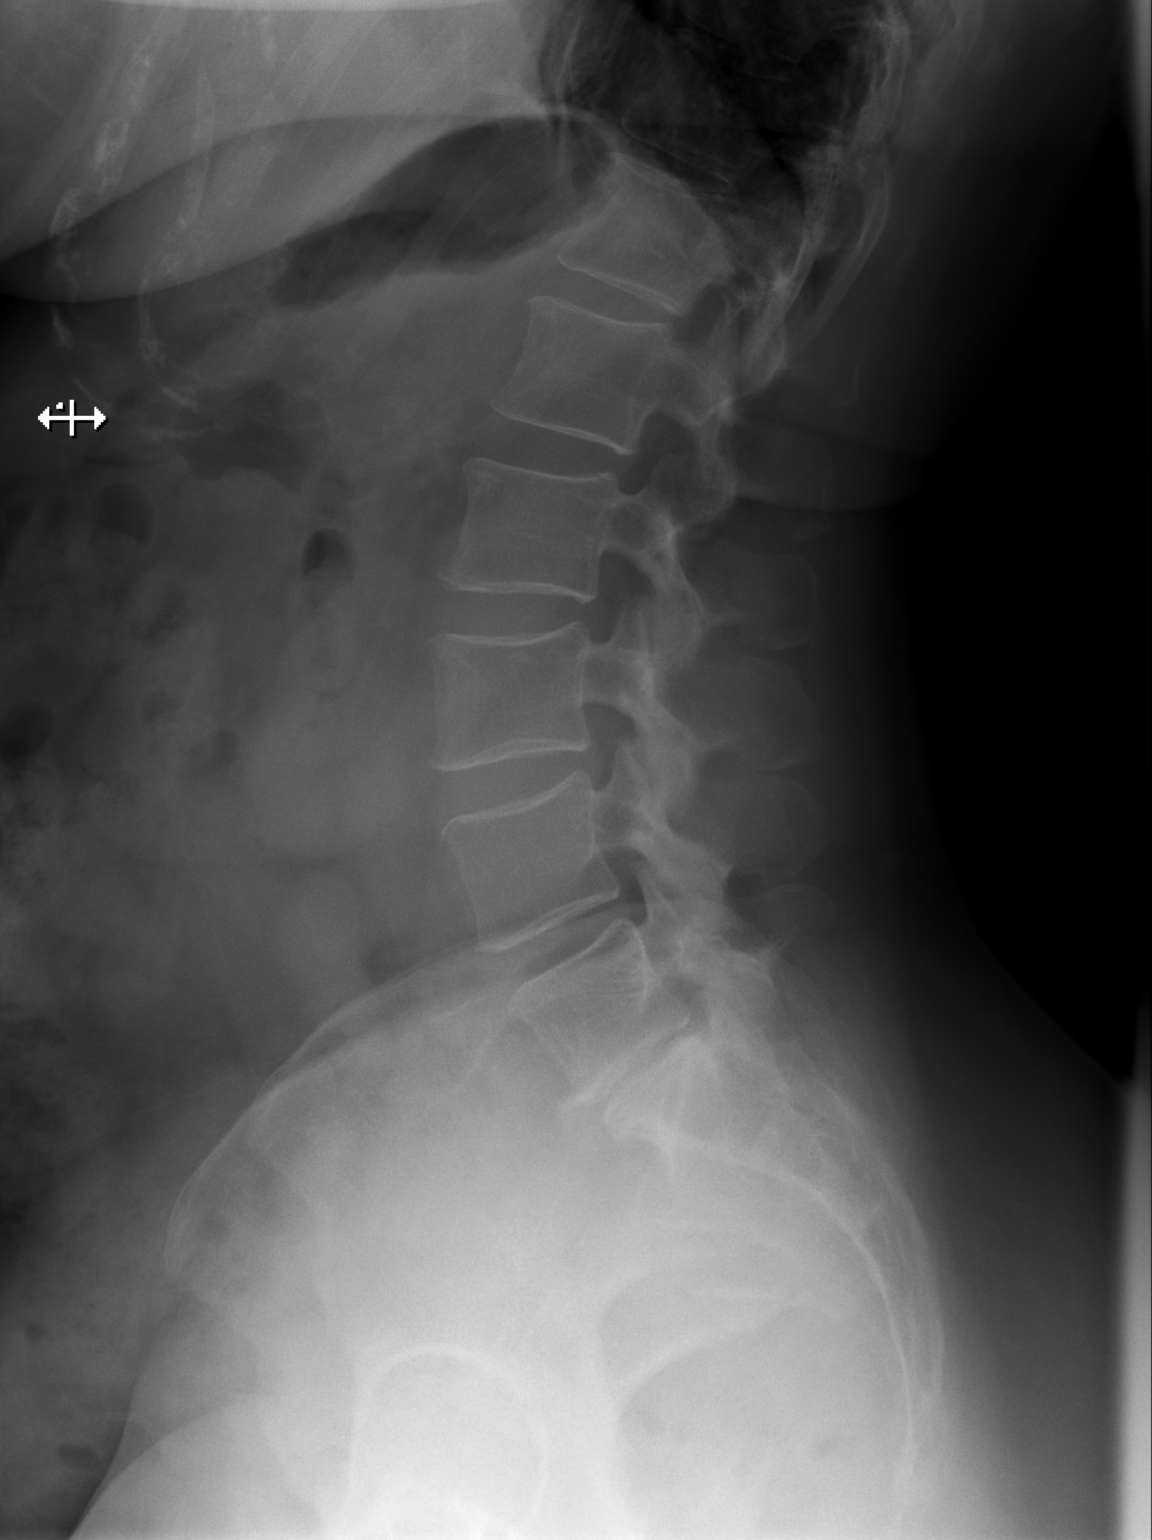

[w lumbar l-5 s-1 spot]
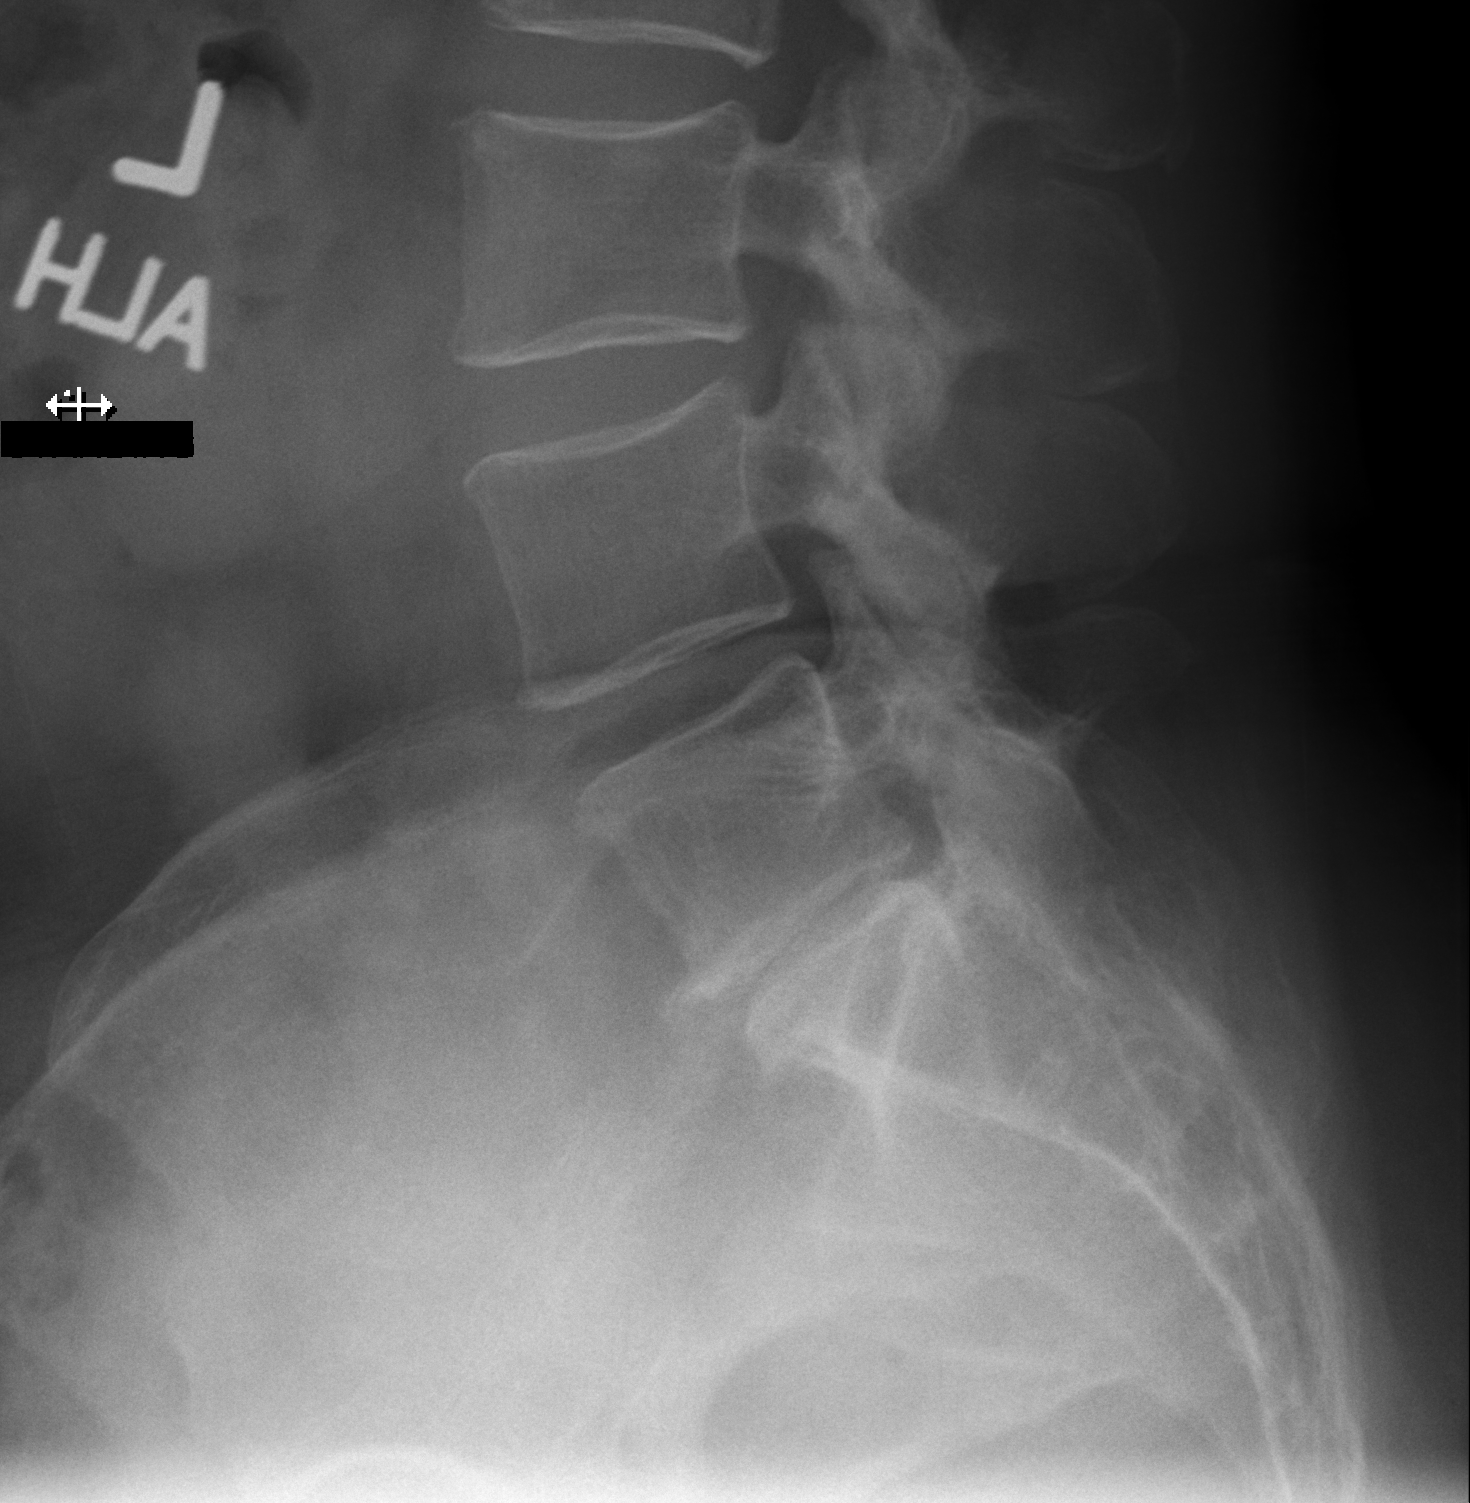

[3 of 3 positions shown; findings below may reference images not displayed]

FINDINGS: There is no evidence of lumbar spine fracture. Alignment is normal.
Moderate degenerative disc disease is noted at L5-S1.
IMPRESSION: Moderate degenerative disc disease noted at L5-S1. No acute
abnormality seen.

## 2023-02-13 ENCOUNTER — Other Ambulatory Visit: Payer: Self-pay | Admitting: Obstetrics and Gynecology

## 2023-02-13 DIAGNOSIS — R928 Other abnormal and inconclusive findings on diagnostic imaging of breast: Secondary | ICD-10-CM

## 2023-02-22 ENCOUNTER — Ambulatory Visit: Admission: RE | Admit: 2023-02-22 | Payer: BC Managed Care – PPO | Source: Ambulatory Visit

## 2023-02-22 ENCOUNTER — Ambulatory Visit
Admission: RE | Admit: 2023-02-22 | Discharge: 2023-02-22 | Disposition: A | Discharge status: Home / Self Care | Payer: BC Managed Care – PPO | Source: Ambulatory Visit | Attending: Obstetrics and Gynecology | Admitting: Obstetrics and Gynecology

## 2023-02-22 DIAGNOSIS — R928 Other abnormal and inconclusive findings on diagnostic imaging of breast: Secondary | ICD-10-CM
